# Patient Record
Sex: Female | Born: 1980 | Race: White | Hispanic: No | Marital: Married | Smoking: Light tobacco smoker
Health system: Southern US, Community
[De-identification: ages and names within clinical notes are randomized; demographics above are authoritative.]

## PROBLEM LIST (undated history)

## (undated) HISTORY — PX: TUBAL LIGATION: SHX77

## (undated) HISTORY — PX: NASAL SINUS SURGERY: SHX719

---

## 2005-05-22 ENCOUNTER — Inpatient Hospital Stay: Payer: Self-pay | Admitting: Surgery

## 2005-09-04 ENCOUNTER — Emergency Department: Payer: Self-pay | Admitting: General Practice

## 2005-09-11 ENCOUNTER — Emergency Department (HOSPITAL_COMMUNITY): Admission: EM | Admit: 2005-09-11 | Discharge: 2005-09-11 | Payer: Self-pay | Admitting: Emergency Medicine

## 2005-09-18 ENCOUNTER — Ambulatory Visit: Payer: Self-pay | Admitting: Otolaryngology

## 2008-06-16 ENCOUNTER — Encounter: Payer: Self-pay | Admitting: Anesthesiology

## 2008-06-29 ENCOUNTER — Encounter: Payer: Self-pay | Admitting: Anesthesiology

## 2009-04-05 ENCOUNTER — Emergency Department: Payer: Self-pay | Admitting: Emergency Medicine

## 2010-12-09 ENCOUNTER — Emergency Department: Payer: Self-pay | Admitting: Emergency Medicine

## 2011-08-06 ENCOUNTER — Ambulatory Visit: Payer: Self-pay | Admitting: Family Medicine

## 2012-12-11 ENCOUNTER — Emergency Department: Payer: Self-pay | Admitting: Emergency Medicine

## 2013-11-17 ENCOUNTER — Emergency Department: Payer: Self-pay | Admitting: Emergency Medicine

## 2015-01-10 ENCOUNTER — Encounter: Payer: Self-pay | Admitting: Emergency Medicine

## 2015-01-10 ENCOUNTER — Emergency Department
Admission: EM | Admit: 2015-01-10 | Discharge: 2015-01-11 | Disposition: A | Discharge status: Home / Self Care | Payer: Medicaid Other | Attending: Emergency Medicine | Admitting: Emergency Medicine

## 2015-01-10 DIAGNOSIS — R000 Tachycardia, unspecified: Secondary | ICD-10-CM | POA: Insufficient documentation

## 2015-01-10 DIAGNOSIS — R109 Unspecified abdominal pain: Secondary | ICD-10-CM

## 2015-01-10 DIAGNOSIS — Z3202 Encounter for pregnancy test, result negative: Secondary | ICD-10-CM | POA: Diagnosis not present

## 2015-01-10 DIAGNOSIS — R509 Fever, unspecified: Secondary | ICD-10-CM

## 2015-01-10 DIAGNOSIS — N12 Tubulo-interstitial nephritis, not specified as acute or chronic: Secondary | ICD-10-CM | POA: Insufficient documentation

## 2015-01-10 LAB — URINALYSIS COMPLETE WITH MICROSCOPIC (ARMC ONLY)
Bilirubin Urine: NEGATIVE
Glucose, UA: NEGATIVE mg/dL
Hgb urine dipstick: NEGATIVE
Ketones, ur: NEGATIVE mg/dL
Nitrite: NEGATIVE
PH: 7 (ref 5.0–8.0)
PROTEIN: NEGATIVE mg/dL
Specific Gravity, Urine: 1.009 (ref 1.005–1.030)

## 2015-01-10 MED ORDER — IOHEXOL 240 MG/ML SOLN
25.0000 mL | Freq: Once | INTRAMUSCULAR | Status: AC | PRN
  Administered 2015-01-10: 25 mL via ORAL

## 2015-01-10 MED ORDER — MORPHINE SULFATE (PF) 2 MG/ML IV SOLN
2.0000 mg | Freq: Once | INTRAVENOUS | Status: AC
  Administered 2015-01-10: 2 mg via INTRAVENOUS
  Filled 2015-01-10: qty 1

## 2015-01-10 MED ORDER — ONDANSETRON HCL 4 MG/2ML IJ SOLN
4.0000 mg | Freq: Once | INTRAMUSCULAR | Status: AC
  Administered 2015-01-10: 4 mg via INTRAVENOUS
  Filled 2015-01-10: qty 2

## 2015-01-10 MED ORDER — ACETAMINOPHEN 500 MG PO TABS
1000.0000 mg | ORAL_TABLET | Freq: Once | ORAL | Status: AC
  Administered 2015-01-10: 1000 mg via ORAL
  Filled 2015-01-10: qty 2

## 2015-01-10 MED ORDER — SODIUM CHLORIDE 0.9 % IV BOLUS (SEPSIS)
1000.0000 mL | Freq: Once | INTRAVENOUS | Status: AC
  Administered 2015-01-10: 1000 mL via INTRAVENOUS

## 2015-01-10 NOTE — ED Provider Notes (Signed)
[facility] Emergency Department Provider Note  ____________________________________________  Time seen: Approximately 11:28 PM  I have reviewed the triage vital signs and the nursing notes.   HISTORY  Chief Complaint Flank Pain    HPI Jessica Blake is a 34 y.o. female who presents to the ED from home with a chief complaint of fever, right flank pain, nausea/vomiting. Onset of symptoms approximately 6 days ago. Patient reports she was seen by her PCP yesterday and diagnosed with UTI, prescribe Cipro which she had taken approximately 4 doses. Today she was seen at a local urgent care and told "it may be her gallbladder and she may be pregnant but because she was on Azo it may interfere with her negative pregnancy results".complains of generalized malaise, persistent fever, increasing right flank pain associated with dysuria, nausea and vomiting. Denies chest pain, shortness of breath, diarrhea, headache, rash. Denies recent travel. Nothing makes her symptoms better or worse.   History reviewed. No pertinent past medical history.  There are no active problems to display for this patient.   Past Surgical History  Procedure Laterality Date  . Tubal ligation    . Nasal sinus surgery      No current outpatient prescriptions on file.  Allergies Review of patient's allergies indicates no known allergies.  No family history on file.  Social History Social History  Substance Use Topics  . Smoking status: None  . Smokeless tobacco: None  . Alcohol Use: None    Review of Systems Constitutional: Positive for fever/chills Eyes: No visual changes. ENT: No sore throat. Cardiovascular: Denies chest pain. Respiratory: Denies shortness of breath. Gastrointestinal: No abdominal pain.  Positive for nausea and vomiting.  No diarrhea.  No constipation. Genitourinary: Positive for dysuria. Positive for right flank pain. Musculoskeletal: Negative for back  pain. Skin: Negative for rash. Neurological: Negative for headaches, focal weakness or numbness.  10-point ROS otherwise negative.  ____________________________________________   PHYSICAL EXAM:  VITAL SIGNS: ED Triage Vitals  Enc Vitals Group     BP --      Pulse Rate 01/10/15 2233 117     Resp 01/10/15 2233 20     Temp 01/10/15 2233 102 F (38.9 C)     Temp Source 01/10/15 2233 Oral     SpO2 01/10/15 2233 98 %     Weight 01/10/15 2233 141 lb (63.957 kg)     Height 01/10/15 2233  (1.676 m)     Head Cir --      Peak Flow --      Pain Score 01/10/15 2236 8     Pain Loc --      Pain Edu? --      Excl. in GC? --     Constitutional: Alert and oriented. Well appearing and in moderate acute distress. Eyes: Conjunctivae are normal. PERRL. EOMI. Head: Atraumatic. Nose: No congestion/rhinnorhea. Mouth/Throat: Mucous membranes are moist.  Oropharynx non-erythematous. Neck: No stridor.  Cardiovascular: Tachycardic rate, regular rhythm. Grossly normal heart sounds.  Good peripheral circulation. Respiratory: Normal respiratory effort.  No retractions. Lungs CTAB. Gastrointestinal: Soft and nontender. No distention. No abdominal bruits. Moderate right CVA tenderness. Musculoskeletal: No lower extremity tenderness nor edema.  No joint effusions. Neurologic:  Normal speech and language. No gross focal neurologic deficits are appreciated.  Skin:  Skin is warm, dry and intact. No rash noted. No petechiae. Psychiatric: Mood and affect are normal. Speech and behavior are normal.  ____________________________________________   LABS (all labs ordered are  listed, but only abnormal results are displayed)  Labs Reviewed  CBC WITH DIFFERENTIAL/PLATELET - Abnormal; Notable for the following:    Monocytes Absolute 1.0 (*)    All other components within normal limits  COMPREHENSIVE METABOLIC PANEL - Abnormal; Notable for the following:    Sodium 133 (*)    Calcium 8.6 (*)    All other  components within normal limits  LIPASE, BLOOD - Abnormal; Notable for the following:    Lipase 18 (*)    All other components within normal limits  URINALYSIS COMPLETEWITH MICROSCOPIC ([facility] ONLY) - Abnormal; Notable for the following:    Color, Urine YELLOW (*)    APPearance CLOUDY (*)    Leukocytes, UA TRACE (*)    Bacteria, UA RARE (*)    Squamous Epithelial / LPF 6-30 (*)    All other components within normal limits  CULTURE, BLOOD (ROUTINE X 2)  CULTURE, BLOOD (ROUTINE X 2)  URINE CULTURE  HCG, QUANTITATIVE, PREGNANCY   ____________________________________________  EKG  None ____________________________________________  RADIOLOGY  CT abdomen and pelvis with contrast interpreted per Dr. Andria MeuseStevens: Abnormal right renal nephrogram with right perinephric fluid, right ureteral and bladder wall thickening consistent with right pyelonephritis and cystitis.  ____________________________________________   PROCEDURES  Procedure(s) performed: None  Critical Care performed: No  ____________________________________________   INITIAL IMPRESSION / ASSESSMENT AND PLAN / ED COURSE  Pertinent labs & imaging results that were available during my care of the patient were reviewed by me and considered in my medical decision making (see chart for details).  [age] year old female who presents with fever, right flank pain, dysuria on Cipro. She is febrile and tachycardic; symptoms concerning for acute pyelonephritis. Will administer antipyretic, obtain screening lab work including blood and urine cultures, beta hCG. Will obtain CT abdomen/pelvis to evaluate for pyelonephritis.  ----------------------------------------- 3:12 AM on 01/11/2015 -----------------------------------------  Patient is overall feeling better. She is snuggling in the bed with her fianc.she is asking for something to eat. Will continue to monitor her clinical status as IV antibiotics infused. If patient continues  to appear clinically well, I think she will be safe for discharge home. If she becomes hypotensive I will not hesitate to admit her to the hospital.  ----------------------------------------- 4:10 AM on 01/11/2015 -----------------------------------------  Patient sleeping in no acute distress. She ate a sandwich tray; feeling much better. Remains hemodynamically stable and now not tachycardic. Will add Keflex to the Cipro she is already taking. Strict return precautions given. Patient verbalizes understanding and agrees with plan of care. ____________________________________________   FINAL CLINICAL IMPRESSION(S) / ED DIAGNOSES  Final diagnoses:  Pyelonephritis  Fever, unspecified fever cause  Flank pain, acute      Irean HongJade J Greysin Medlen, MD 01/11/15 [phone]

## 2015-01-10 NOTE — ED Notes (Signed)
Pt to triage via w/c, appears uncomfortable, shivering; reports seen by PCP yesterday and dx with UTI, rx cipro & has taken approx 4 doses; today seen at urgent care and told "?gallbladder and may be pregnant but because she was on azo if may interfer with her negative results"; pt c/o increasing right flank pain, fever, N/V

## 2015-01-11 ENCOUNTER — Emergency Department: Payer: Medicaid Other

## 2015-01-11 LAB — COMPREHENSIVE METABOLIC PANEL
ALBUMIN: 3.7 g/dL (ref 3.5–5.0)
ALK PHOS: 56 U/L (ref 38–126)
ALT: 20 U/L (ref 14–54)
ANION GAP: 5 (ref 5–15)
AST: 26 U/L (ref 15–41)
BUN: 6 mg/dL (ref 6–20)
CALCIUM: 8.6 mg/dL — AB (ref 8.9–10.3)
CHLORIDE: 102 mmol/L (ref 101–111)
CO2: 26 mmol/L (ref 22–32)
Creatinine, Ser: 0.61 mg/dL (ref 0.44–1.00)
GFR calc Af Amer: >60 mL/min (ref >60)
GFR calc non Af Amer: >60 mL/min (ref >60)
GLUCOSE: 90 mg/dL (ref 65–99)
Potassium: 3.5 mmol/L (ref 3.5–5.1)
SODIUM: 133 mmol/L — AB (ref 135–145)
Total Bilirubin: 1.2 mg/dL (ref 0.3–1.2)
Total Protein: 6.7 g/dL (ref 6.5–8.1)

## 2015-01-11 LAB — CBC WITH DIFFERENTIAL/PLATELET
BASOS PCT: 0 %
Basophils Absolute: 0 10*3/uL (ref 0–0.1)
EOS ABS: 0.1 10*3/uL (ref 0–0.7)
Eosinophils Relative: 1 %
HCT: 36.2 % (ref 35.0–47.0)
HEMOGLOBIN: 12.6 g/dL (ref 12.0–16.0)
Lymphocytes Relative: 20 %
Lymphs Abs: 1.8 10*3/uL (ref 1.0–3.6)
MCH: 31 pg (ref 26.0–34.0)
MCHC: 34.7 g/dL (ref 32.0–36.0)
MCV: 89.5 fL (ref 80.0–100.0)
MONOS PCT: 11 %
Monocytes Absolute: 1 10*3/uL — ABNORMAL HIGH (ref 0.2–0.9)
NEUTROS PCT: 68 %
Neutro Abs: 6.1 10*3/uL (ref 1.4–6.5)
PLATELETS: 217 10*3/uL (ref 150–440)
RBC: 4.05 MIL/uL (ref 3.80–5.20)
RDW: 11.6 % (ref 11.5–14.5)
WBC: 9 10*3/uL (ref 3.6–11.0)

## 2015-01-11 LAB — HCG, QUANTITATIVE, PREGNANCY: hCG, Beta Chain, Quant, S: 3 m[IU]/mL (ref <5)

## 2015-01-11 LAB — LIPASE, BLOOD: Lipase: 18 U/L — ABNORMAL LOW (ref 22–51)

## 2015-01-11 MED ORDER — IBUPROFEN 600 MG PO TABS: 600.0000 mg | ORAL_TABLET | Freq: Three times a day (TID) | ORAL | Status: DC | PRN

## 2015-01-11 MED ORDER — IOHEXOL 300 MG/ML  SOLN
100.0000 mL | Freq: Once | INTRAMUSCULAR | Status: AC | PRN
  Administered 2015-01-11: 100 mL via INTRAVENOUS

## 2015-01-11 MED ORDER — KETOROLAC TROMETHAMINE 30 MG/ML IJ SOLN
30.0000 mg | Freq: Once | INTRAMUSCULAR | Status: AC
  Administered 2015-01-11: 30 mg via INTRAVENOUS
  Filled 2015-01-11: qty 1

## 2015-01-11 MED ORDER — SODIUM CHLORIDE 0.9 % IV BOLUS (SEPSIS)
1000.0000 mL | Freq: Once | INTRAVENOUS | Status: AC
  Administered 2015-01-11: 1000 mL via INTRAVENOUS

## 2015-01-11 MED ORDER — CEPHALEXIN 500 MG PO CAPS: 500.0000 mg | ORAL_CAPSULE | Freq: Four times a day (QID) | ORAL | Status: DC

## 2015-01-11 MED ORDER — DEXTROSE 5 % IV SOLN
2.0000 g | INTRAVENOUS | Status: DC
  Administered 2015-01-11: 2 g via INTRAVENOUS
  Filled 2015-01-11: qty 2

## 2015-01-11 MED ORDER — HYDROCODONE-ACETAMINOPHEN 5-325 MG PO TABS: 1.0000 | ORAL_TABLET | Freq: Four times a day (QID) | ORAL | Status: DC | PRN

## 2015-01-11 NOTE — ED Notes (Signed)
Patient transported to CT via stretcher.

## 2015-01-11 NOTE — Discharge Instructions (Signed)
1. Add Keflex 500 mg 4 times daily 7 days to the antibiotic you are already taking. 2. You may take pain medicines as needed (Motrin/Norco #15). 3. Alternate Motrin and Tylenol every 4 hours as needed for fever greater than 100.4F. 4. Drink plenty of fluids daily. 5. Return to the ER for worsening symptoms, persistent vomiting, difficulty breathing or other concerns.  Fever, Adult A fever is an increase in the body's temperature. It is usually defined as a temperature of 100F (38C) or higher. Brief mild or moderate fevers generally have no long-term effects, and they often do not require treatment. Moderate or high fevers may make you feel uncomfortable and can sometimes be a sign of a serious illness or disease. The sweating that may occur with repeated or prolonged fever may also cause dehydration. Fever is confirmed by taking a temperature with a thermometer. A measured temperature can vary with:  Age.  Time of day.  Location of the thermometer:  Mouth (oral).  Rectum (rectal).  Ear (tympanic).  Underarm (axillary).  Forehead (temporal). HOME CARE INSTRUCTIONS Pay attention to any changes in your symptoms. Take these actions to help with your condition:  Take over-the counter and prescription medicines only as told by your health care provider. Follow the dosing instructions carefully.  If you were prescribed an antibiotic medicine, take it as told by your health care provider. Do not stop taking the antibiotic even if you start to feel better.  Rest as needed.  Drink enough fluid to keep your urine clear or pale yellow. This helps to prevent dehydration.  Sponge yourself or bathe with room-temperature water to help reduce your body temperature as needed. Do not use ice water.  Do not overbundle yourself in blankets or heavy clothes. SEEK MEDICAL CARE IF:  You vomit.  You cannot eat or drink without vomiting.  You have diarrhea.  You have pain when you  urinate.  Your symptoms do not improve with treatment.  You develop new symptoms.  You develop excessive weakness. SEEK IMMEDIATE MEDICAL CARE IF:  You have shortness of breath or have trouble breathing.  You are dizzy or you faint.  You are disoriented or confused.  You develop signs of dehydration, such as a dry mouth, decreased urination, or paleness.  You develop severe pain in your abdomen.  You have persistent vomiting or diarrhea.  You develop a skin rash.  Your symptoms suddenly get worse.   This information is not intended to replace advice given to you by your health care provider. Make sure you discuss any questions you have with your health care provider.   Document Released: 09/10/2000 Document Revised: 12/06/2014 Document Reviewed: 05/11/2014 Elsevier Interactive Patient Education 2016 Else[employer]  Flank Pain Flank pain refers to pain that is located on the side of the body between the upper abdomen and the back. The pain may occur over a short period of time (acute) or may be long-term or reoccurring (chronic). It may be mild or severe. Flank pain can be caused by many things. CAUSES  Some of the more common causes of flank pain include:  Muscle strains.   Muscle spasms.   A disease of your spine (vertebral disk disease).   A lung infection (pneumonia).   Fluid around your lungs (pulmonary edema).   A kidney infection.   Kidney stones.   A very painful skin rash caused by the chickenpox virus (shingles).   Gallbladder disease.  HOME CARE INSTRUCTIONS  Home care will depend  on the cause of your pain. In general,  Rest as directed by your caregiver.  Drink enough fluids to keep your urine clear or pale yellow.  Only take over-the-counter or prescription medicines as directed by your caregiver. Some medicines may help relieve the pain.  Tell your caregiver about any changes in your pain.  Follow up with your caregiver as  directed. SEEK IMMEDIATE MEDICAL CARE IF:   Your pain is not controlled with medicine.   You have new or worsening symptoms.  Your pain increases.   You have abdominal pain.   You have shortness of breath.   You have persistent nausea or vomiting.   You have swelling in your abdomen.   You feel faint or pass out.   You have blood in your urine.  You have a fever or persistent symptoms for more than 2-3 days.  You have a fever and your symptoms suddenly get worse. MAKE SURE YOU:   Understand these instructions.  Will watch your condition.  Will get help right away if you are not doing well or get worse.   This information is not intended to replace advice given to you by your health care provider. Make sure you discuss any questions you have with your health care provider.   Document Released: 05/08/2005 Document Revised: 12/10/2011 Document Reviewed: 10/30/2011 Elsevier Interactive Patient Education 2016 Elsevier Inc.  Pyelonephritis, Adult Pyelonephritis is a kidney infection. The kidneys are organs that help clean your blood by moving waste out of your blood and into your pee (urine). This infection can happen quickly, or it can last for a long time. In most cases, it clears up with treatment and does not cause other problems. HOME CARE Medicines  Take over-the-counter and prescription medicines only as told by your doctor.  Take your antibiotic medicine as told by your doctor. Do not stop taking the medicine even if you start to feel better. General Instructions  Drink enough fluid to keep your pee clear or pale yellow.  Avoid caffeine, tea, and carbonated drinks.  Pee (urinate) often. Avoid holding in pee for long periods of time.  Pee before and after sex.  After pooping (having a bowel movement), women should wipe from front to back. Use each tissue only once.  Keep all follow-up visits as told by your doctor. This is important. GET HELP  IF:  You do not feel better after 2 days.  Your symptoms get worse.  You have a fever. GET HELP RIGHT AWAY IF:  You cannot take your medicine or drink fluids as told.  You have chills and shaking.  You throw up (vomit).  You have very bad pain in your side (flank) or back.  You feel very weak or you pass out (faint).   This information is not intended to replace advice given to you by your health care provider. Make sure you discuss any questions you have with your health care provider.   Document Released: 04/24/2004 Document Revised: 12/06/2014 Document Reviewed: 07/10/2014 Elsevier Interactive Patient Education [employer] Elsevier Inc.

## 2015-01-11 NOTE — ED Notes (Signed)
Pt ambulatory to and from restroom without incident.

## 2015-01-11 NOTE — ED Notes (Signed)
Boxed lunch and sprite provided to patient.

## 2015-01-13 LAB — URINE CULTURE
Culture: 30000
SPECIAL REQUESTS: NORMAL

## 2015-01-16 LAB — CULTURE, BLOOD (ROUTINE X 2)
CULTURE: NO GROWTH
Culture: NO GROWTH

## 2017-05-29 ENCOUNTER — Emergency Department
Admission: EM | Admit: 2017-05-29 | Discharge: 2017-05-29 | Disposition: A | Discharge status: Home / Self Care | Payer: No Typology Code available for payment source | Attending: Emergency Medicine | Admitting: Emergency Medicine

## 2017-05-29 ENCOUNTER — Other Ambulatory Visit: Payer: Self-pay

## 2017-05-29 DIAGNOSIS — F17228 Nicotine dependence, chewing tobacco, with other nicotine-induced disorders: Secondary | ICD-10-CM | POA: Insufficient documentation

## 2017-05-29 DIAGNOSIS — Y998 Other external cause status: Secondary | ICD-10-CM | POA: Insufficient documentation

## 2017-05-29 DIAGNOSIS — Y9389 Activity, other specified: Secondary | ICD-10-CM | POA: Insufficient documentation

## 2017-05-29 DIAGNOSIS — M7918 Myalgia, other site: Secondary | ICD-10-CM | POA: Diagnosis not present

## 2017-05-29 DIAGNOSIS — Y92410 Unspecified street and highway as the place of occurrence of the external cause: Secondary | ICD-10-CM | POA: Insufficient documentation

## 2017-05-29 DIAGNOSIS — Z79899 Other long term (current) drug therapy: Secondary | ICD-10-CM | POA: Diagnosis not present

## 2017-05-29 DIAGNOSIS — S199XXA Unspecified injury of neck, initial encounter: Secondary | ICD-10-CM | POA: Diagnosis present

## 2017-05-29 DIAGNOSIS — S161XXA Strain of muscle, fascia and tendon at neck level, initial encounter: Secondary | ICD-10-CM | POA: Diagnosis not present

## 2017-05-29 MED ORDER — CYCLOBENZAPRINE HCL 10 MG PO TABS
10.0000 mg | ORAL_TABLET | Freq: Once | ORAL | Status: AC
  Administered 2017-05-29: 10 mg via ORAL
  Filled 2017-05-29: qty 1

## 2017-05-29 MED ORDER — TRAMADOL HCL 50 MG PO TABS: 50.0000 mg | ORAL_TABLET | Freq: Four times a day (QID) | ORAL | 0 refills | Status: AC | PRN

## 2017-05-29 MED ORDER — TRAMADOL HCL 50 MG PO TABS
50.0000 mg | ORAL_TABLET | Freq: Once | ORAL | Status: AC
  Administered 2017-05-29: 50 mg via ORAL
  Filled 2017-05-29: qty 1

## 2017-05-29 MED ORDER — IBUPROFEN 600 MG PO TABS: 600.0000 mg | ORAL_TABLET | Freq: Three times a day (TID) | ORAL | 0 refills | Status: DC | PRN

## 2017-05-29 MED ORDER — IBUPROFEN 600 MG PO TABS
600.0000 mg | ORAL_TABLET | Freq: Once | ORAL | Status: AC
  Administered 2017-05-29: 600 mg via ORAL
  Filled 2017-05-29: qty 1

## 2017-05-29 MED ORDER — CYCLOBENZAPRINE HCL 10 MG PO TABS: 10.0000 mg | ORAL_TABLET | Freq: Three times a day (TID) | ORAL | 0 refills | Status: DC | PRN
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
## 2017-06-30 ENCOUNTER — Ambulatory Visit
Admission: RE | Admit: 2017-06-30 | Discharge: 2017-06-30 | Disposition: A | Discharge status: Home / Self Care | Payer: No Typology Code available for payment source | Source: Ambulatory Visit | Attending: Chiropractor | Admitting: Chiropractor

## 2017-06-30 ENCOUNTER — Other Ambulatory Visit: Payer: Self-pay | Admitting: Chiropractor

## 2017-06-30 DIAGNOSIS — M549 Dorsalgia, unspecified: Secondary | ICD-10-CM | POA: Diagnosis present

## 2017-06-30 DIAGNOSIS — M5135 Other intervertebral disc degeneration, thoracolumbar region: Secondary | ICD-10-CM | POA: Diagnosis not present

## 2017-06-30 DIAGNOSIS — R208 Other disturbances of skin sensation: Secondary | ICD-10-CM

## 2018-04-17 ENCOUNTER — Emergency Department
Admission: EM | Admit: 2018-04-17 | Discharge: 2018-04-17 | Disposition: A | Discharge status: Home / Self Care | Payer: Self-pay | Attending: Emergency Medicine | Admitting: Emergency Medicine

## 2018-04-17 ENCOUNTER — Other Ambulatory Visit: Payer: Self-pay

## 2018-04-17 ENCOUNTER — Encounter: Payer: Self-pay | Admitting: Emergency Medicine

## 2018-04-17 DIAGNOSIS — H6992 Unspecified Eustachian tube disorder, left ear: Secondary | ICD-10-CM | POA: Insufficient documentation

## 2018-04-17 DIAGNOSIS — J029 Acute pharyngitis, unspecified: Secondary | ICD-10-CM | POA: Insufficient documentation

## 2018-04-17 DIAGNOSIS — H6982 Other specified disorders of Eustachian tube, left ear: Secondary | ICD-10-CM

## 2018-04-17 DIAGNOSIS — H9202 Otalgia, left ear: Secondary | ICD-10-CM

## 2018-04-17 DIAGNOSIS — F17290 Nicotine dependence, other tobacco product, uncomplicated: Secondary | ICD-10-CM | POA: Insufficient documentation

## 2018-04-17 LAB — GROUP A STREP BY PCR: GROUP A STREP BY PCR: NOT DETECTED

## 2018-04-17 MED ORDER — METHYLPREDNISOLONE 4 MG PO TBPK: ORAL_TABLET | ORAL | 0 refills | Status: DC

## 2018-04-17 MED ORDER — PREDNISONE 10 MG PO TABS
30.0000 mg | ORAL_TABLET | Freq: Once | ORAL | Status: AC
  Administered 2018-04-17: 07:00:00 30 mg via ORAL
  Filled 2018-04-17: qty 1

## 2018-04-17 MED ORDER — LIDOCAINE HCL (PF) 1 % IJ SOLN
2.0000 mL | Freq: Once | INTRAMUSCULAR | Status: AC
  Administered 2018-04-17: 2 mL
  Filled 2018-04-17: qty 5

## 2018-04-17 NOTE — Discharge Instructions (Signed)
1.  Take steroid taper as prescribed. 2.  Return to the ER for worsening symptoms, persistent vomiting, difficulty breathing or other concerns.

## 2018-04-17 NOTE — ED Triage Notes (Signed)
Pt arrives ambulatory and POV to triage with c/o left ear pain and sore throat. Pt states that she gets strep throat "normally every other year" but that this does not feel like strep. Pt is in NAD.

## 2018-04-17 NOTE — ED Notes (Signed)
Reports left ear pain and sore throat.  Reports gets strep throat frequently.

## 2018-04-17 NOTE — ED Provider Notes (Signed)
[facility] Emergency Department Provider Note   ____________________________________________   First MD Initiated Contact with Patient 04/17/18 [phone]     (approximate)  I have reviewed the triage vital signs and the nursing notes.   HISTORY  Chief Complaint Otalgia and Sore Throat    HPI Jessica Blake is a 38 y.o. female who presents to the ED from home with a chief complaint of left ear pain and sore throat.  Patient reports a 2 to 3-day history of the above symptoms.  History of strep throat but states this does not feel like strep.  Has been exposed to sick contacts.  Denies associated fever, chills, chest pain, shortness of breath, abdominal pain, nausea, vomiting, diarrhea.  Denies recent travel or trauma.   Past medical history None  There are no active problems to display for this patient.   Past Surgical History:  Procedure Laterality Date  . NASAL SINUS SURGERY    . TUBAL LIGATION      Prior to Admission medications   Medication Sig Start Date End Date Taking? Authorizing Provider  cephALEXin (KEFLEX) 500 MG capsule Take 1 capsule (500 mg total) by mouth 4 (four) times daily. 01/11/15   Irean Hong, MD  cyclobenzaprine (FLEXERIL) 10 MG tablet Take 1 tablet (10 mg total) by mouth 3 (three) times daily as needed. 05/29/17   Joni Reining, PA-C  HYDROcodone-acetaminophen (NORCO) 5-325 MG tablet Take 1 tablet by mouth every 6 (six) hours as needed for moderate pain. 01/11/15   Irean Hong, MD  ibuprofen (ADVIL,MOTRIN) 600 MG tablet Take 1 tablet (600 mg total) by mouth every 8 (eight) hours as needed. 01/11/15   Irean Hong, MD  ibuprofen (ADVIL,MOTRIN) 600 MG tablet Take 1 tablet (600 mg total) by mouth every 8 (eight) hours as needed. 05/29/17   Joni Reining, PA-C  methylPREDNISolone (MEDROL DOSEPAK) 4 MG TBPK tablet Take as directed 04/17/18   Irean Hong, MD  traMADol (ULTRAM) 50 MG tablet Take 1 tablet (50 mg total) by mouth every 6  (six) hours as needed. 05/29/17 05/29/18  Joni Reining, PA-C    Allergies Patient has no known allergies.  No family history on file.  Social History Social History   Tobacco Use  . Smoking status: Light Tobacco Smoker    Types: E-cigarettes  . Smokeless tobacco: Current User  Substance Use Topics  . Alcohol use: Yes    Frequency: Never  . Drug use: Yes    Types: Marijuana    Comment: socially    Review of Systems  Constitutional: No fever/chills Eyes: No visual changes. ENT: Positive for left earache and sore throat. Cardiovascular: Denies chest pain. Respiratory: Denies shortness of breath. Gastrointestinal: No abdominal pain.  No nausea, no vomiting.  No diarrhea.  No constipation. Genitourinary: Negative for dysuria. Musculoskeletal: Negative for back pain. Skin: Negative for rash. Neurological: Negative for headaches, focal weakness or numbness.   ____________________________________________   PHYSICAL EXAM:  VITAL SIGNS: ED Triage Vitals  Enc Vitals Group     BP 04/17/18 0513 122/78     Pulse Rate 04/17/18 0513 87     Resp 04/17/18 0513 18     Temp 04/17/18 0513 98 F (36.7 C)     Temp Source 04/17/18 0513 Oral     SpO2 04/17/18 0513 98 %     Weight 04/17/18 0511 152 lb (68.9 kg)     Height 04/17/18 0511 5\' 6"  (1.676 m)  Head Circumference --      Peak Flow --      Pain Score 04/17/18 0511 5     Pain Loc --      Pain Edu? --      Excl. in GC? --     Constitutional: Alert and oriented. Well appearing and in no acute distress. Eyes: Conjunctivae are normal. PERRL. EOMI. Head: Atraumatic. Ears: Right TM within normal limits.  Left TM bulging with fluid without erythema or perforation. Nose: No congestion/rhinnorhea. Mouth/Throat: Mucous membranes are moist.  Oropharynx minimally erythematous without tonsillar swelling, exudates or peritonsillar abscess.  There is no hoarse or muffled voice.  There is no drooling. Neck: No stridor.  Supple neck  without meningismus. Hematological/Lymphatic/Immunilogical: No cervical lymphadenopathy. Cardiovascular: Normal rate, regular rhythm. Grossly normal heart sounds.  Good peripheral circulation. Respiratory: Normal respiratory effort.  No retractions. Lungs CTAB. Gastrointestinal: Soft and nontender. No distention. No abdominal bruits. No CVA tenderness. Musculoskeletal: No lower extremity tenderness nor edema.  No joint effusions. Neurologic:  Normal speech and language. No gross focal neurologic deficits are appreciated. No gait instability. Skin:  Skin is warm, dry and intact. No rash noted.  No petechiae. Psychiatric: Mood and affect are normal. Speech and behavior are normal.  ____________________________________________   LABS (all labs ordered are listed, but only abnormal results are displayed)  Labs Reviewed  GROUP A STREP BY PCR   ____________________________________________  EKG  None ____________________________________________  RADIOLOGY  ED MD interpretation: None  Official radiology report(s): No results found.  ____________________________________________   PROCEDURES  Procedure(s) performed: None  Procedures  Critical Care performed: No  ____________________________________________   INITIAL IMPRESSION / ASSESSMENT AND PLAN / ED COURSE  As part of my medical decision making, I reviewed the following data within the electronic MEDICAL RECORD NUMBER History obtained from family, Nursing notes reviewed and incorporated and Notes from prior ED visits   38 year old female who presents with sore throat and left ear pain.  Will start Medrol Dosepak for eustachian tube dysfunction.  Lidocaine drops applied to left ear for pain.  Strict return precautions given.  Patient verbalizes understanding and agrees with plan of care.      ____________________________________________   FINAL CLINICAL IMPRESSION(S) / ED DIAGNOSES  Final diagnoses:  Sore throat    Left ear pain  Dysfunction of left eustachian tube     ED Discharge Orders         Ordered    methylPREDNISolone (MEDROL DOSEPAK) 4 MG TBPK tablet     04/17/18 [id number]           Note:  This document was prepared using Dragon voice recognition software and may include unintentional dictation errors.    Irean HongSung,  J, MD 04/17/18 [phone]

## 2019-02-14 ENCOUNTER — Other Ambulatory Visit: Payer: Self-pay

## 2019-02-14 DIAGNOSIS — Z20822 Contact with and (suspected) exposure to covid-19: Secondary | ICD-10-CM

## 2019-02-16 LAB — NOVEL CORONAVIRUS, NAA: SARS-CoV-2, NAA: NOT DETECTED
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
## 2021-04-23 ENCOUNTER — Ambulatory Visit: Payer: 59 | Admitting: Podiatry

## 2021-04-29 ENCOUNTER — Ambulatory Visit: Payer: 59 | Admitting: Podiatry

## 2021-05-02 ENCOUNTER — Ambulatory Visit: Payer: 59 | Admitting: Podiatry

## 2021-05-10 ENCOUNTER — Other Ambulatory Visit: Payer: Self-pay

## 2021-05-10 ENCOUNTER — Ambulatory Visit (INDEPENDENT_AMBULATORY_CARE_PROVIDER_SITE_OTHER): Payer: 59 | Admitting: Podiatry

## 2021-05-10 DIAGNOSIS — L600 Ingrowing nail: Secondary | ICD-10-CM | POA: Diagnosis not present

## 2021-05-10 DIAGNOSIS — B351 Tinea unguium: Secondary | ICD-10-CM | POA: Diagnosis not present

## 2021-05-10 DIAGNOSIS — Z79899 Other long term (current) drug therapy: Secondary | ICD-10-CM

## 2021-05-10 NOTE — Patient Instructions (Signed)
Terbinafine Tablets °What is this medication? °TERBINAFINE (TER bin a feen) treats fungal infections of the nails. It belongs to a group of medications called antifungals. It will not treat infections caused by bacteria or viruses. °This medicine may be used for other purposes; ask your health care provider or pharmacist if you have questions. °COMMON BRAND NAME(S): Lamisil, Terbinex °What should I tell my care team before I take this medication? °They need to know if you have any of these conditions: °Liver disease °An unusual or allergic reaction to terbinafine, other medications, foods, dyes, or preservatives °Pregnant or trying to get pregnant °Breast-feeding °How should I use this medication? °Take this medication by mouth with water. Take it as directed on the prescription label at the same time every day. You can take it with or without food. If it upsets your stomach, take it with food. Keep taking it unless your care team tells you to stop. °A special MedGuide will be given to you by the pharmacist with each prescription and refill. Be sure to read this information carefully each time. °Talk to your care team regarding the use of this medication in children. Special care may be needed. °Overdosage: If you think you have taken too much of this medicine contact a poison control center or emergency room at once. °NOTE: This medicine is only for you. Do not share this medicine with others. °What if I miss a dose? °If you miss a dose, take it as soon as you can unless it is more than 4 hours late. If it is more than 4 hours late, skip the missed dose. Take the next dose at the normal time. °What may interact with this medication? °Do not take this medication with any of the following: °Pimozide °Thioridazine °This medication may also interact with the following: °Beta blockers °Caffeine °Certain medications for mental health conditions °Cimetidine °Cyclosporine °Medications for fungal infections like fluconazole  and ketoconazole °Medications for irregular heartbeat like amiodarone, flecainide and propafenone °Rifampin °Warfarin °This list may not describe all possible interactions. Give your health care provider a list of all the medicines, herbs, non-prescription drugs, or dietary supplements you use. Also tell them if you smoke, drink alcohol, or use illegal drugs. Some items may interact with your medicine. °What should I watch for while using this medication? °Visit your care team for regular checks on your progress. You may need blood work while you are taking this medication. It may be some time before you see the benefit from this medication. °This medication may cause serious skin reactions. They can happen weeks to months after starting the medication. Contact your care team right away if you notice fevers or flu-like symptoms with a rash. The rash may be red or purple and then turn into blisters or peeling of the skin. Or, you might notice a red rash with swelling of the face, lips or lymph nodes in your neck or under your arms. °This medication can make you more sensitive to the sun. Keep out of the sun, If you cannot avoid being in the sun, wear protective clothing and sunscreen. Do not use sun lamps or tanning beds/booths. °What side effects may I notice from receiving this medication? °Side effects that you should report to your care team as soon as possible: °Allergic reactions--skin rash, itching, hives, swelling of the face, lips, tongue, or throat °Change in sense of smell °Change in taste °Infection--fever, chills, cough, or sore throat °Liver injury--right upper belly pain, loss of appetite, nausea,   light-colored stool, dark yellow or brown urine, yellowing skin or eyes, unusual weakness or fatigue °Low red blood cell level--unusual weakness or fatigue, dizziness, headache, trouble breathing °Lupus-like syndrome--joint pain, swelling, or stiffness, butterfly-shaped rash on the face, rashes that get worse  in the sun, fever, unusual weakness or fatigue °Rash, fever, and swollen lymph nodes °Redness, blistering, peeling, or loosening of the skin, including inside the mouth °Unusual bruising or bleeding °Worsening mood, feelings of depression °Side effects that usually do not require medical attention (report to your care team if they continue or are bothersome): °Diarrhea °Gas °Headache °Nausea °Stomach pain °Upset stomach °This list may not describe all possible side effects. Call your doctor for medical advice about side effects. You may report side effects to FDA at 1-800-FDA-1088. °Where should I keep my medication? °Keep out of the reach of children and pets. °Store between 20 and 25 degrees C (68 and 77 degrees F). Protect from light. Get rid of any unused medication after the expiration date. °To get rid of medications that are no longer needed or have expired: °Take the medication to a medication take-back program. Check with your pharmacy or law enforcement to find a location. °If you cannot return the medication, check the label or package insert to see if the medication should be thrown out in the garbage or flushed down the toilet. If you are not sure, ask your care team. If it is safe to put it in the trash, take the medication out of the container. Mix the medication with cat litter, dirt, coffee grounds, or other unwanted substance. Seal the mixture in a bag or container. Put it in the trash. °NOTE: This sheet is a summary. It may not cover all possible information. If you have questions about this medicine, talk to your doctor, pharmacist, or health care provider. °© 2022 [employer]/Gold Standard (2020-10-31 00:00:00) ° °

## 2021-05-11 LAB — CBC WITH DIFFERENTIAL/PLATELET
Absolute Monocytes: 440 cells/uL (ref 200–950)
Basophils Absolute: 58 cells/uL (ref 0–200)
Basophils Relative: 1.1 %
Eosinophils Absolute: 297 cells/uL (ref 15–500)
Eosinophils Relative: 5.6 %
HCT: 39.9 % (ref 35.0–45.0)
Hemoglobin: 13.5 g/dL (ref 11.7–15.5)
Lymphs Abs: 1765 cells/uL (ref 850–3900)
MCH: 31.5 pg (ref 27.0–33.0)
MCHC: 33.8 g/dL (ref 32.0–36.0)
MCV: 93.2 fL (ref 80.0–100.0)
MPV: 10.2 fL (ref 7.5–12.5)
Monocytes Relative: 8.3 %
Neutro Abs: 2740 cells/uL (ref 1500–7800)
Neutrophils Relative %: 51.7 %
Platelets: 320 10*3/uL (ref 140–400)
RBC: 4.28 10*6/uL (ref 3.80–5.10)
RDW: 12.3 % (ref 11.0–15.0)
Total Lymphocyte: 33.3 %
WBC: 5.3 10*3/uL (ref 3.8–10.8)

## 2021-05-11 LAB — HEPATIC FUNCTION PANEL
AG Ratio: 1.8 (calc) (ref 1.0–2.5)
ALT: 13 U/L (ref 6–29)
AST: 19 U/L (ref 10–30)
Albumin: 4.2 g/dL (ref 3.6–5.1)
Alkaline phosphatase (APISO): 60 U/L (ref 31–125)
Bilirubin, Direct: 0.2 mg/dL (ref 0.0–0.2)
Globulin: 2.4 g/dL (calc) (ref 1.9–3.7)
Indirect Bilirubin: 0.8 mg/dL (calc) (ref 0.2–1.2)
Total Bilirubin: 1 mg/dL (ref 0.2–1.2)
Total Protein: 6.6 g/dL (ref 6.1–8.1)

## 2021-05-13 ENCOUNTER — Other Ambulatory Visit: Payer: Self-pay | Admitting: Podiatry

## 2021-05-13 ENCOUNTER — Telehealth: Payer: Self-pay | Admitting: *Deleted

## 2021-05-13 DIAGNOSIS — B351 Tinea unguium: Secondary | ICD-10-CM

## 2021-05-13 DIAGNOSIS — Z79899 Other long term (current) drug therapy: Secondary | ICD-10-CM

## 2021-05-13 MED ORDER — TERBINAFINE HCL 250 MG PO TABS: 250.0000 mg | ORAL_TABLET | Freq: Every day | ORAL | 0 refills | Status: DC

## 2021-05-13 NOTE — Telephone Encounter (Signed)
Patient is calling for status of lab results and a prescription that is supposed to be sent to pharmacy. Please advise.

## 2021-05-14 NOTE — Progress Notes (Signed)
Subjective:   Patient ID: Dairl Ponder, female   DOB: 41 y.o.   MRN: [id number]   HPI 41 year old female presents the office today for concerns for nail fungus as well as her nails becoming ingrown.  She states has been ongoing in 2015.  She has occasional discomfort to the right hallux toenail gets ingrown most with pressure in shoes.  She has been has tried creams as well as nail gel without any significant improvement.  She has no other concerns today.   Review of Systems  All other systems reviewed and are negative.  No past medical history on file.  Past Surgical History:  Procedure Laterality Date   NASAL SINUS SURGERY     TUBAL LIGATION       Current Outpatient Medications:    cephALEXin (KEFLEX) 500 MG capsule, Take 1 capsule (500 mg total) by mouth 4 (four) times daily., Disp: 28 capsule, Rfl: 0   cyclobenzaprine (FLEXERIL) 10 MG tablet, Take 1 tablet (10 mg total) by mouth 3 (three) times daily as needed., Disp: 15 tablet, Rfl: 0   HYDROcodone-acetaminophen (NORCO) 5-325 MG tablet, Take 1 tablet by mouth every 6 (six) hours as needed for moderate pain., Disp: 15 tablet, Rfl: 0   ibuprofen (ADVIL,MOTRIN) 600 MG tablet, Take 1 tablet (600 mg total) by mouth every 8 (eight) hours as needed., Disp: 15 tablet, Rfl: 0   ibuprofen (ADVIL,MOTRIN) 600 MG tablet, Take 1 tablet (600 mg total) by mouth every 8 (eight) hours as needed., Disp: 15 tablet, Rfl: 0   methylPREDNISolone (MEDROL DOSEPAK) 4 MG TBPK tablet, Take as directed, Disp: 21 tablet, Rfl: 0   terbinafine (LAMISIL) 250 MG tablet, Take 1 tablet (250 mg total) by mouth daily., Disp: 90 tablet, Rfl: 0  No Known Allergies        Objective:  Physical Exam  General: AAO x3, NAD-she is tearful and upset about her toenails.  Dermatological: Nails are hypertrophic, dystrophic with yellow, brown discoloration.  There is incurvation present hallux toenail there is no edema, erythema, drainage or pus or any signs of  infection.  Hyperkeratotic lesion right medial hallux without any underlying ulceration drainage or signs of infection.  Vascular: Dorsalis Pedis artery and Posterior Tibial artery pedal pulses are 2/4 bilateral with immedate capillary fill time.  There is no pain with calf compression, swelling, warmth, erythema.   Neruologic: Grossly intact via light touch bilateral.  Musculoskeletal: No gross boney pedal deformities bilateral. No pain, crepitus, or limitation noted with foot and ankle range of motion bilateral. Muscular strength 5/5 in all groups tested bilateral.  Gait: Unassisted, Nonantalgic.       Assessment:   41 year old female with onychomycosis, ingrown toenail     Plan:  -Treatment options discussed including all alternatives, risks, and complications -Etiology of symptoms were discussed -Sharply debrided the nails with any complications or bleeding in particular I debrided the ingrown toenail right hallux without any complications or bleeding.  Discussed partial nail avulsion if needed.  Discussed treatment options for nail fungus with oral, topical medication.  She wants to do oral Lamisil.  We will check a CBC and LFT.  Discussed side effects medication.  Sharp debrided the hyperkeratotic lesion or any complications or bleeding as a courtesy.  Vivi Barrack DPM

## 2021-05-15 ENCOUNTER — Other Ambulatory Visit: Payer: Self-pay | Admitting: Podiatry

## 2021-05-15 MED ORDER — TERBINAFINE HCL 250 MG PO TABS: 250.0000 mg | ORAL_TABLET | Freq: Every day | ORAL | 0 refills | Status: DC

## 2021-06-17 ENCOUNTER — Ambulatory Visit: Payer: Self-pay | Admitting: Family Medicine

## 2021-06-17 NOTE — Progress Notes (Deleted)
? ? ?New patient visit ? ? ?Patient: Jessica Blake   DOB: 11/23/1980   41 y.o. Female  MRN: [id number] ?Visit Date: 06/17/2021 ? ?Today's healthcare provider: Jacky Kindle, FNP  ? ?No chief complaint on file. ? ?Subjective  ?  ?Jessica Blake is a 41 y.o. female who presents today as a new patient to establish care.  ?HPI  ?*** ? ?No past medical history on file. ?Past Surgical History:  ?Procedure Laterality Date  ? NASAL SINUS SURGERY    ? TUBAL LIGATION    ? ?No family status information on file.  ? ?No family history on file. ?Social History  ? ?Socioeconomic History  ? Marital status: Married  ?  Spouse name: Not on file  ? Number of children: Not on file  ? Years of education: Not on file  ? Highest education level: Not on file  ?Occupational History  ? Not on file  ?Tobacco Use  ? Smoking status: Light Smoker  ?  Types: E-cigarettes  ? Smokeless tobacco: Current  ?Substance and Sexual Activity  ? Alcohol use: Yes  ? Drug use: Yes  ?  Types: Marijuana  ?  Comment: socially  ? Sexual activity: Not on file  ?Other Topics Concern  ? Not on file  ?Social History Narrative  ? Not on file  ? ?Social Determinants of Health  ? ?Financial Resource Strain: Not on file  ?Food Insecurity: Not on file  ?Transportation Needs: Not on file  ?Physical Activity: Not on file  ?Stress: Not on file  ?Social Connections: Not on file  ? ?Outpatient Medications Prior to Visit  ?Medication Sig  ? cephALEXin (KEFLEX) 500 MG capsule Take 1 capsule (500 mg total) by mouth 4 (four) times daily.  ? cyclobenzaprine (FLEXERIL) 10 MG tablet Take 1 tablet (10 mg total) by mouth 3 (three) times daily as needed.  ? HYDROcodone-acetaminophen (NORCO) 5-325 MG tablet Take 1 tablet by mouth every 6 (six) hours as needed for moderate pain.  ? ibuprofen (ADVIL,MOTRIN) 600 MG tablet Take 1 tablet (600 mg total) by mouth every 8 (eight) hours as needed.  ? ibuprofen (ADVIL,MOTRIN) 600 MG tablet Take 1 tablet (600 mg total) by mouth every 8 (eight)  hours as needed.  ? methylPREDNISolone (MEDROL DOSEPAK) 4 MG TBPK tablet Take as directed  ? terbinafine (LAMISIL) 250 MG tablet Take 1 tablet (250 mg total) by mouth daily.  ? ?No facility-administered medications prior to visit.  ? ?No Known Allergies ? ? ?There is no immunization history on file for this patient. ? ?Health Maintenance  ?Topic Date Due  ? COVID-19 Vaccine (1) Never done  ? HIV Screening  Never done  ? Hepatitis C Screening  Never done  ? TETANUS/TDAP  Never done  ? PAP SMEAR-Modifier  Never done  ? INFLUENZA VACCINE  10/29/2020  ? HPV VACCINES  Aged Out  ? ? ?Patient Care Team: ?Patient, No Pcp Per (Inactive) as PCP - General (General Practice) ? ?Review of Systems  ?All other systems reviewed and are negative. ? ?{Labs  Heme  Chem  Endocrine  Serology  Results Review (optional):[id number]} ? Objective  ?  ?There were no vitals taken for this visit. ?Physical Exam ?*** ? ?Depression Screen ?No flowsheet data found. ?No results found for any visits on 06/17/21. ? Assessment & Plan   ?  ?*** ? ?No follow-ups on file.  ?  ? ?{provider attestation***:1} ? ? ?Jacky Kindle, FNP  ?[facility] ?[phone] (  phone) ?[phone] (fax) ? ?[facility] ? ?

## 2021-08-09 ENCOUNTER — Ambulatory Visit: Payer: 59 | Admitting: Podiatry

## 2022-08-26 ENCOUNTER — Other Ambulatory Visit: Payer: Self-pay

## 2022-08-26 ENCOUNTER — Encounter (HOSPITAL_BASED_OUTPATIENT_CLINIC_OR_DEPARTMENT_OTHER): Payer: Self-pay | Admitting: Emergency Medicine

## 2022-08-26 ENCOUNTER — Emergency Department (HOSPITAL_BASED_OUTPATIENT_CLINIC_OR_DEPARTMENT_OTHER)
Admission: EM | Admit: 2022-08-26 | Discharge: 2022-08-26 | Disposition: A | Discharge status: Home / Self Care | Payer: Commercial Managed Care - HMO | Attending: Emergency Medicine | Admitting: Emergency Medicine

## 2022-08-26 ENCOUNTER — Emergency Department (HOSPITAL_BASED_OUTPATIENT_CLINIC_OR_DEPARTMENT_OTHER): Payer: Commercial Managed Care - HMO

## 2022-08-26 DIAGNOSIS — M546 Pain in thoracic spine: Secondary | ICD-10-CM | POA: Diagnosis present

## 2022-08-26 DIAGNOSIS — M542 Cervicalgia: Secondary | ICD-10-CM | POA: Insufficient documentation

## 2022-08-26 LAB — PREGNANCY, URINE: Preg Test, Ur: NEGATIVE

## 2022-08-26 MED ORDER — KETOROLAC TROMETHAMINE 60 MG/2ML IM SOLN
30.0000 mg | Freq: Once | INTRAMUSCULAR | Status: AC
  Administered 2022-08-26: 30 mg via INTRAMUSCULAR
  Filled 2022-08-26: qty 2
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
## 2022-08-26 NOTE — ED Notes (Signed)
Patient transported to CT 
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
## 2024-03-04 ENCOUNTER — Encounter (HOSPITAL_COMMUNITY): Payer: Self-pay | Admitting: *Deleted

## 2024-03-04 ENCOUNTER — Ambulatory Visit (HOSPITAL_COMMUNITY)
Admission: EM | Admit: 2024-03-04 | Discharge: 2024-03-04 | Disposition: A | Discharge status: Home / Self Care | Payer: Self-pay | Attending: Emergency Medicine | Admitting: Emergency Medicine

## 2024-03-04 DIAGNOSIS — S60459A Superficial foreign body of unspecified finger, initial encounter: Secondary | ICD-10-CM

## 2024-03-04 DIAGNOSIS — Z23 Encounter for immunization: Secondary | ICD-10-CM

## 2024-03-04 MED ORDER — IBUPROFEN 800 MG PO TABS
ORAL_TABLET | ORAL | Status: AC
  Filled 2024-03-04: qty 1

## 2024-03-04 MED ORDER — TETANUS-DIPHTH-ACELL PERTUSSIS 5-2-15.5 LF-MCG/0.5 IM SUSP
INTRAMUSCULAR | Status: AC
  Filled 2024-03-04: qty 0.5

## 2024-03-04 MED ORDER — IBUPROFEN 800 MG PO TABS
800.0000 mg | ORAL_TABLET | Freq: Once | ORAL | Status: AC
  Administered 2024-03-04: 800 mg via ORAL

## 2024-03-04 MED ORDER — MUPIROCIN 2 % EX OINT: 1.0000 | TOPICAL_OINTMENT | Freq: Two times a day (BID) | CUTANEOUS | 0 refills | Status: AC

## 2024-03-04 MED ORDER — CEPHALEXIN 500 MG PO CAPS: 500.0000 mg | ORAL_CAPSULE | Freq: Two times a day (BID) | ORAL | 0 refills | Status: AC

## 2024-03-04 MED ORDER — TETANUS-DIPHTH-ACELL PERTUSSIS 5-2-15.5 LF-MCG/0.5 IM SUSP
0.5000 mL | Freq: Once | INTRAMUSCULAR | Status: AC
  Administered 2024-03-04: 0.5 mL via INTRAMUSCULAR

## 2024-03-04 NOTE — Discharge Instructions (Addendum)
 Apply mupirocin  ointment twice daily for infection prevention or as you are doing dressing changes. Keep the area clean dry and covered for infection prevention.  You can clean with soap and water. Start taking cephalexin  twice daily for 5 days for infection prevention as well. If you notice swelling, spreading of redness, or purulent drainage return here for reevaluation.

## 2024-03-04 NOTE — ED Provider Notes (Signed)
 [facility]    CSN: [id number] Arrival date & time: 03/04/24  1522      History   Chief Complaint Chief Complaint  Patient presents with   Foreign Body    HPI Jessica Blake is a 43 y.o. female.   Patient presents with splinter that is stuck in her left middle finger that occurred earlier in the day yesterday.  Patient states that she was moving something when it and the splinter was jammed into her finger.  Patient reports that she did try to apply some cream to the area and her husband tried to remove it last night with a knife and tweezers but was unsuccessful.  Patient reports increased pain to the area, but denies any purulent drainage from the area.  Patient unsure of last Tdap.  The history is provided by the patient and medical records.  Foreign Body   History reviewed. No pertinent past medical history.  There are no active problems to display for this patient.   Past Surgical History:  Procedure Laterality Date   NASAL SINUS SURGERY     TUBAL LIGATION      OB History   No obstetric history on file.      Home Medications    Prior to Admission medications   Medication Sig Start Date End Date Taking? Authorizing Provider  cephALEXin  (KEFLEX ) 500 MG capsule Take 1 capsule (500 mg total) by mouth 2 (two) times daily for 5 days. 03/04/24 03/09/24 Yes Fabyan Loughmiller A, NP  mupirocin  ointment (BACTROBAN ) 2 % Apply 1 Application topically 2 (two) times daily. 03/04/24  Yes Johnie Rumaldo LABOR, NP    Family History History reviewed. No pertinent family history.  Social History Social History   Tobacco Use   Smoking status: Light Smoker    Types: E-cigarettes   Smokeless tobacco: Current  Vaping Use   Vaping status: Never Used  Substance Use Topics   Alcohol use: Yes   Drug use: Yes    Types: Marijuana    Comment: socially     Allergies   Patient has no known allergies.   Review of Systems Review of Systems  Per HPI  Physical  Exam Triage Vital Signs ED Triage Vitals  Encounter Vitals Group     BP 03/04/24 1552 123/69     Girls Systolic BP Percentile --      Girls Diastolic BP Percentile --      Boys Systolic BP Percentile --      Boys Diastolic BP Percentile --      Pulse Rate 03/04/24 1552 79     Resp 03/04/24 1552 16     Temp 03/04/24 1552 98.2 F (36.8 C)     Temp Source 03/04/24 1552 Oral     SpO2 03/04/24 1552 97 %     Weight --      Height --      Head Circumference --      Peak Flow --      Pain Score 03/04/24 1550 7     Pain Loc --      Pain Education --      Exclude from Growth Chart --    No data found.  Updated Vital Signs BP 123/69 (BP Location: Left Arm)   Pulse 79   Temp 98.2 F (36.8 C) (Oral)   Resp 16   LMP 03/04/2024 (Exact Date)   SpO2 97%   Visual Acuity Right Eye Distance:   Left Eye Distance:  Bilateral Distance:    Right Eye Near:   Left Eye Near:    Bilateral Near:     Physical Exam Vitals and nursing note reviewed.  Constitutional:      General: She is awake. She is not in acute distress.    Appearance: Normal appearance. She is well-developed and well-groomed. She is not ill-appearing.  Musculoskeletal:       Hands:     Comments: Pinpoint area of open skin with surrounding erythema that has dark center consistent with possible splinter being in this area.  Skin:    General: Skin is warm and dry.     Findings: Erythema present.  Neurological:     Mental Status: She is alert.  Psychiatric:        Behavior: Behavior is cooperative.      UC Treatments / Results  Labs (all labs ordered are listed, but only abnormal results are displayed) Labs Reviewed - No data to display  EKG   Radiology No results found.  Procedures Foreign Body Removal  Date/Time: 03/04/2024 4:56 PM  Performed by: Johnie Rumaldo LABOR, NP Authorized by: Johnie Rumaldo LABOR, NP   Consent:    Consent obtained:  Verbal   Consent given by:  Patient   Risks, benefits, and  alternatives were discussed: yes     Risks discussed:  Bleeding, infection, pain, incomplete removal and worsening of condition   Alternatives discussed:  No treatment, alternative treatment and observation Universal protocol:    Procedure explained and questions answered to patient or proxy's satisfaction: yes     Relevant documents present and verified: yes     Patient identity confirmed:  Verbally with patient Location:    Location:  Finger   Finger location:  L middle finger   Depth:  Intradermal   Tendon involvement:  None Pre-procedure details:    Imaging:  None   Neurovascular status: intact   Anesthesia:    Anesthesia method:  None Procedure type:    Procedure complexity:  Simple Procedure details:    Localization method:  Visualized   Removal mechanism:  Forceps   Foreign bodies recovered:  1   Description:  1 cm wooden splinter   Intact foreign body removal: yes   Post-procedure details:    Neurovascular status: intact     Confirmation:  No additional foreign bodies on visualization   Skin closure:  None   Dressing:  Antibiotic ointment and bulky dressing   Procedure completion:  Tolerated well, no immediate complications  (including critical care time)  Medications Ordered in UC Medications  ibuprofen  (ADVIL ) tablet 800 mg (800 mg Oral Given 03/04/24 1648)  Tdap (ADACEL ) injection 0.5 mL (0.5 mLs Intramuscular Given 03/04/24 1647)    Initial Impression / Assessment and Plan / UC Course  I have reviewed the triage vital signs and the nursing notes.  Pertinent labs & imaging results that were available during my care of the patient were reviewed by me and considered in my medical decision making (see chart for details).     Patient is overall well-appearing.  Vitals are stable.  Foreign body removal performed as documented above.  Patient tolerated this well without any local anesthesia.  Updated Tdap in clinic.  Given 800 mg ibuprofen  in clinic for pain.   Applied dressing with antibiotic ointment.  Prescribe mupirocin  and cephalexin  for infection prevention.  Discussed follow-up and strict return precautions. Final Clinical Impressions(s) / UC Diagnoses   Final diagnoses:  Finger, superficial foreign body (splinter),  initial encounter     Discharge Instructions      Apply mupirocin  ointment twice daily for infection prevention or as you are doing dressing changes. Keep the area clean dry and covered for infection prevention.  You can clean with soap and water. Start taking cephalexin  twice daily for 5 days for infection prevention as well. If you notice swelling, spreading of redness, or purulent drainage return here for reevaluation.   ED Prescriptions     Medication Sig Dispense Auth. Provider   mupirocin  ointment (BACTROBAN ) 2 % Apply 1 Application topically 2 (two) times daily. 22 g Johnie Flaming A, NP   cephALEXin  (KEFLEX ) 500 MG capsule Take 1 capsule (500 mg total) by mouth 2 (two) times daily for 5 days. 10 capsule Johnie Flaming A, NP      PDMP not reviewed this encounter.   Johnie Flaming A, NP 03/04/24 [phone]

## 2024-03-04 NOTE — ED Triage Notes (Signed)
 Pt states she has a splinter in her left middle finger yesterday. She put some cream on it she got from [facility] but no help. Her husband tried to get it out last night
